# Patient Record
Sex: Female | Born: 1975 | Race: White | Hispanic: No | Marital: Married | Smoking: Former smoker
Health system: Southern US, Community
[De-identification: ages and names within clinical notes are randomized; demographics above are authoritative.]

## PROBLEM LIST (undated history)

## (undated) DIAGNOSIS — C801 Malignant (primary) neoplasm, unspecified: Secondary | ICD-10-CM

## (undated) HISTORY — PX: THYROIDECTOMY: SHX17

## (undated) HISTORY — PX: APPENDECTOMY: SHX54

---

## 2001-04-02 ENCOUNTER — Ambulatory Visit (HOSPITAL_COMMUNITY): Admission: AD | Admit: 2001-04-02 | Discharge: 2001-04-02 | Payer: Self-pay | Admitting: Obstetrics & Gynecology

## 2001-04-02 ENCOUNTER — Encounter (INDEPENDENT_AMBULATORY_CARE_PROVIDER_SITE_OTHER): Payer: Self-pay

## 2002-03-24 ENCOUNTER — Other Ambulatory Visit: Admission: RE | Admit: 2002-03-24 | Discharge: 2002-03-24 | Payer: Self-pay | Admitting: Obstetrics and Gynecology

## 2002-04-25 ENCOUNTER — Encounter: Admission: RE | Admit: 2002-04-25 | Discharge: 2002-04-25 | Payer: Self-pay | Admitting: Thoracic Surgery

## 2002-04-25 ENCOUNTER — Encounter: Payer: Self-pay | Admitting: Thoracic Surgery

## 2007-03-01 ENCOUNTER — Inpatient Hospital Stay (HOSPITAL_COMMUNITY): Admission: AD | Admit: 2007-03-01 | Discharge: 2007-03-05 | Payer: Self-pay | Admitting: Obstetrics & Gynecology

## 2007-03-02 ENCOUNTER — Encounter (INDEPENDENT_AMBULATORY_CARE_PROVIDER_SITE_OTHER): Payer: Self-pay | Admitting: Specialist

## 2008-04-23 ENCOUNTER — Ambulatory Visit (HOSPITAL_COMMUNITY): Admission: RE | Admit: 2008-04-23 | Discharge: 2008-04-23 | Payer: Self-pay | Admitting: Obstetrics and Gynecology

## 2008-11-15 ENCOUNTER — Inpatient Hospital Stay (HOSPITAL_COMMUNITY): Admission: RE | Admit: 2008-11-15 | Discharge: 2008-11-18 | Payer: Self-pay | Admitting: Obstetrics & Gynecology

## 2009-09-20 ENCOUNTER — Inpatient Hospital Stay (HOSPITAL_COMMUNITY): Admission: EM | Admit: 2009-09-20 | Discharge: 2009-09-23 | Payer: Self-pay | Admitting: Emergency Medicine

## 2009-09-20 ENCOUNTER — Encounter (INDEPENDENT_AMBULATORY_CARE_PROVIDER_SITE_OTHER): Payer: Self-pay | Admitting: Surgery

## 2009-09-27 ENCOUNTER — Inpatient Hospital Stay (HOSPITAL_COMMUNITY): Admission: RE | Admit: 2009-09-27 | Discharge: 2009-10-02 | Payer: Self-pay | Admitting: Surgery

## 2010-03-27 ENCOUNTER — Encounter: Admission: RE | Admit: 2010-03-27 | Discharge: 2010-03-27 | Payer: Self-pay | Admitting: Family Medicine

## 2010-06-10 ENCOUNTER — Emergency Department (HOSPITAL_COMMUNITY): Admission: EM | Admit: 2010-06-10 | Discharge: 2010-06-10 | Payer: Self-pay | Admitting: Emergency Medicine

## 2010-10-03 ENCOUNTER — Encounter: Admission: RE | Admit: 2010-10-03 | Discharge: 2010-10-03 | Payer: Self-pay | Admitting: Otolaryngology

## 2010-10-08 ENCOUNTER — Other Ambulatory Visit: Admission: RE | Admit: 2010-10-08 | Discharge: 2010-10-08 | Payer: Self-pay | Admitting: Otolaryngology

## 2010-12-04 ENCOUNTER — Encounter (HOSPITAL_COMMUNITY)
Admission: RE | Admit: 2010-12-04 | Discharge: 2011-01-13 | Payer: Self-pay | Source: Home / Self Care | Attending: Endocrinology | Admitting: Endocrinology

## 2011-03-11 ENCOUNTER — Other Ambulatory Visit: Payer: Self-pay | Admitting: General Surgery

## 2011-03-11 ENCOUNTER — Encounter (HOSPITAL_COMMUNITY): Payer: BC Managed Care – PPO

## 2011-03-11 ENCOUNTER — Other Ambulatory Visit (HOSPITAL_COMMUNITY): Payer: Self-pay | Admitting: General Surgery

## 2011-03-11 ENCOUNTER — Ambulatory Visit (HOSPITAL_COMMUNITY)
Admission: RE | Admit: 2011-03-11 | Discharge: 2011-03-11 | Disposition: A | Discharge status: Home / Self Care | Payer: BC Managed Care – PPO | Source: Ambulatory Visit | Attending: General Surgery | Admitting: General Surgery

## 2011-03-11 DIAGNOSIS — Z01818 Encounter for other preprocedural examination: Secondary | ICD-10-CM | POA: Insufficient documentation

## 2011-03-11 DIAGNOSIS — Z01812 Encounter for preprocedural laboratory examination: Secondary | ICD-10-CM | POA: Insufficient documentation

## 2011-03-11 DIAGNOSIS — E041 Nontoxic single thyroid nodule: Secondary | ICD-10-CM

## 2011-03-11 DIAGNOSIS — Z01810 Encounter for preprocedural cardiovascular examination: Secondary | ICD-10-CM | POA: Insufficient documentation

## 2011-03-11 LAB — CBC
HCT: 43.6 % (ref 36.0–46.0)
Hemoglobin: 14.1 g/dL (ref 12.0–15.0)
MCHC: 32.3 g/dL (ref 30.0–36.0)
MCV: 84.7 fL (ref 78.0–100.0)
Platelets: 139 10*3/uL — ABNORMAL LOW (ref 150–400)
RBC: 5.15 MIL/uL — ABNORMAL HIGH (ref 3.87–5.11)
RDW: 12.8 % (ref 11.5–15.5)
WBC: 5.3 10*3/uL (ref 4.0–10.5)

## 2011-03-11 LAB — HCG, SERUM, QUALITATIVE: Preg, Serum: NEGATIVE

## 2011-03-11 LAB — SURGICAL PCR SCREEN: MRSA, PCR: NEGATIVE

## 2011-03-18 ENCOUNTER — Observation Stay (HOSPITAL_COMMUNITY)
Admission: RE | Admit: 2011-03-18 | Discharge: 2011-03-19 | Disposition: A | Discharge status: Home / Self Care | Payer: BC Managed Care – PPO | Source: Ambulatory Visit | Attending: General Surgery | Admitting: General Surgery

## 2011-03-18 ENCOUNTER — Other Ambulatory Visit: Payer: Self-pay | Admitting: General Surgery

## 2011-03-18 DIAGNOSIS — C73 Malignant neoplasm of thyroid gland: Principal | ICD-10-CM | POA: Insufficient documentation

## 2011-03-18 DIAGNOSIS — Z79899 Other long term (current) drug therapy: Secondary | ICD-10-CM | POA: Insufficient documentation

## 2011-03-19 LAB — COMPREHENSIVE METABOLIC PANEL
ALT: 14 U/L (ref 0–35)
AST: 15 U/L (ref 0–37)
Albumin: 4.1 g/dL (ref 3.5–5.2)
BUN: 2 mg/dL — ABNORMAL LOW (ref 6–23)
BUN: 9 mg/dL (ref 6–23)
CO2: 29 mEq/L (ref 19–32)
Calcium: 8.9 mg/dL (ref 8.4–10.5)
Calcium: 9.4 mg/dL (ref 8.4–10.5)
Chloride: 99 mEq/L (ref 96–112)
Creatinine, Ser: 0.59 mg/dL (ref 0.4–1.2)
GFR calc Af Amer: >60 mL/min (ref >60)
GFR calc non Af Amer: >60 mL/min (ref >60)
Glucose, Bld: 100 mg/dL — ABNORMAL HIGH (ref 70–99)
Sodium: 134 mEq/L — ABNORMAL LOW (ref 135–145)
Total Protein: 6.9 g/dL (ref 6.0–8.3)
Total Protein: 8 g/dL (ref 6.0–8.3)

## 2011-03-19 LAB — CBC
HCT: 31.2 % — ABNORMAL LOW (ref 36.0–46.0)
HCT: 32 % — ABNORMAL LOW (ref 36.0–46.0)
HCT: 35.6 % — ABNORMAL LOW (ref 36.0–46.0)
HCT: 35.7 % — ABNORMAL LOW (ref 36.0–46.0)
Hemoglobin: 11.2 g/dL — ABNORMAL LOW (ref 12.0–15.0)
Hemoglobin: 10.6 g/dL — ABNORMAL LOW (ref 12.0–15.0)
Hemoglobin: 12.1 g/dL (ref 12.0–15.0)
Hemoglobin: 15.1 g/dL — ABNORMAL HIGH (ref 12.0–15.0)
MCHC: 33.8 g/dL (ref 30.0–36.0)
MCV: 83.6 fL (ref 78.0–100.0)
MCV: 83.8 fL (ref 78.0–100.0)
MCV: 84.2 fL (ref 78.0–100.0)
MCV: 85.2 fL (ref 78.0–100.0)
Platelets: 96 10*3/uL — ABNORMAL LOW (ref 150–400)
RBC: 3.91 MIL/uL (ref 3.87–5.11)
RBC: 3.66 MIL/uL — ABNORMAL LOW (ref 3.87–5.11)
RBC: 4.24 MIL/uL (ref 3.87–5.11)
RDW: 13.6 % (ref 11.5–15.5)
RDW: 13.1 % (ref 11.5–15.5)
RDW: 13.8 % (ref 11.5–15.5)
RDW: 14.2 % (ref 11.5–15.5)
WBC: 4.5 10*3/uL (ref 4.0–10.5)
WBC: 4 10*3/uL (ref 4.0–10.5)
WBC: 4.6 10*3/uL (ref 4.0–10.5)
WBC: 4.7 10*3/uL (ref 4.0–10.5)
WBC: 8.3 10*3/uL (ref 4.0–10.5)

## 2011-03-19 LAB — URINALYSIS, ROUTINE W REFLEX MICROSCOPIC
Hgb urine dipstick: NEGATIVE
Ketones, ur: >80 mg/dL — AB

## 2011-03-19 LAB — CLOSTRIDIUM DIFFICILE EIA

## 2011-03-19 LAB — ANAEROBIC CULTURE

## 2011-03-19 LAB — DIFFERENTIAL
Basophils Absolute: 0 10*3/uL (ref 0.0–0.1)
Lymphocytes Relative: 8 % — ABNORMAL LOW (ref 12–46)
Lymphs Abs: 0.6 10*3/uL — ABNORMAL LOW (ref 0.7–4.0)
Neutro Abs: 7.2 10*3/uL (ref 1.7–7.7)

## 2011-03-19 LAB — CULTURE, ROUTINE-ABSCESS

## 2011-03-19 LAB — URINE CULTURE
Colony Count: NO GROWTH
Special Requests: NEGATIVE

## 2011-03-19 LAB — LIPASE, BLOOD: Lipase: 24 U/L (ref 11–59)

## 2011-03-19 LAB — URINE MICROSCOPIC-ADD ON

## 2011-03-19 LAB — POCT PREGNANCY, URINE: Preg Test, Ur: NEGATIVE

## 2011-03-25 NOTE — Discharge Summary (Signed)
  Shari Simmons, Shari Simmons                 ACCOUNT NO.:  [id number]  MEDICAL RECORD NO.:  [id number]           PATIENT TYPE:  O  LOCATION:  1306                         FACILITY:  [facility]  PHYSICIAN:  Lennie Muckle, MD      DATE OF BIRTH:  06/09/1976  DATE OF ADMISSION:  03/18/2011 DATE OF DISCHARGE:  03/19/2011                              DISCHARGE SUMMARY   FINAL DIAGNOSIS:  Right thyroid nodule.  PROCEDURE:  Right thyroidectomy.  HOSPITAL COURSE:  [name] was monitored overnight following a right thyroid lobectomy.  She did well overnight, had some mild swelling on her incision, but no active bleeding was noted.  Her voice is strong. She has some pain which is controlled with oral narcotics.  She will be discharged home with Percocet for pain.  Instructed to take stool softeners as well as to take over-the-counter ibuprofen or Motrin.  She will drive when she can turn her head comfortably.  She will follow up with me in approximately 2 weeks.  I have instructed her to perform no heavy lifting over 10 pounds for 2 weeks.  I will also give her a prescription for a Z-PAK in case she does develop a sinus infection which she does frequently and given her Diflucan in the hospital for yeast infection.  CONDITION UPON DISCHARGE:  Improved.  FINAL DIAGNOSIS:  Right thyroid nodule.     Lennie Muckle, MD     ALA/MEDQ  D:  03/19/2011  T:  03/20/2011  Job:  [id number]  cc:   Dorisann Frames, M.D. Fax: [phone]  Electronically Signed by Bertram Savin MD on 03/25/2011 12:13:54 PM

## 2011-03-25 NOTE — Op Note (Signed)
[name], [name]                 ACCOUNT NO.:  [id number]  MEDICAL RECORD NO.:  [id number]           PATIENT TYPE:  O  LOCATION:  1306                         FACILITY:  [facility]  PHYSICIAN:  Lennie Muckle, MD      DATE OF BIRTH:  1976/12/02  DATE OF PROCEDURE:  03/18/2011 DATE OF DISCHARGE:                              OPERATIVE REPORT   PREOPERATIVE DIAGNOSIS:  Right thyroid nodule.  POSTOPERATIVE DIAGNOSIS:  Right thyroid nodule.  PROCEDURE:  Right thyroidectomy.  SURGEON:  Lennie Muckle, MD  ASSISTANT:  OR staff.  FINDINGS:  Nodule right thyroid.  There was a smaller nodule around the middle of the lobe near the entry of the recurrent laryngeal nerve into the trachea.  There were no enlarged lymph nodes.  SPECIMENS:  Right thyroid.  AMOUNT OF BLOOD LOSS:  Minimal.  COMPLICATIONS:  No immediate.  ANESTHESIA:  General endotracheal anesthesia.  INDICATIONS FOR PROCEDURE:  Shari Simmons is a 35 year old female who was found to have an enlarged thyroid.  She had an ultrasound of the thyroid which revealed a lesion within the right lobe measuring 5 x 3 x 2.6. Imaging showed increased uptake into the thyroid gland, but consistent with a toxic nodule.  She occasionally had palpitations and due to the large size of the lesion, I talked to her about a right thyroidectomy. Her TSH level was 0.06, free T3 and T4 within normal limits.  I discussed surgery with her including the risks of possible recurrent laryngeal nerve injury and possibility of having to do a complete thyroidectomy pending path.  Informed consent was obtained.  DETAILS OF PROCEDURE:  Shari Simmons was identified in the preoperative holding area.  I marked the right side of her neck.  She was seen by Anesthesia and received IV antibiotics.  She was then taken to the operating suite, placed in supine position.  After administration of general endotracheal anesthesia, a towel roll was placed behind her neck.  The  anterior neck was prepped and draped in the usual sterile fashion.  Surgical time-out performed.  I began by measuring 2 fingerbreadths above the sternal notch.  Measured an incision approximately 4 cm in size.  Divided the skin with a #15 blade.  Divided subcutaneous tissues and platysma muscle with the electrocautery. Created flaps superiorly and inferiorly. I divided the strap muscles in the midline.  Once I divided the strap muscles, I was able to identify the right lobe of the thyroid gland.  It was soft inside.  I lifted the strap muscles off the right, began dissecting near the superior pole. Carefully dissected around the superior pole, clipped and divided the superior pole artery.  I then continued dissecting down towards the lower portion of the gland.  The strap muscles swept easily off the lobe and I was able to sweep this up into the operative field.  I clipped and divided the inferior thyroid artery.  I dissected towards the middle pole vessels.  I stayed high on the thyroid gland.  There was a small nodule near entry of the middle thyroid artery.  I clipped  and divided the smaller feeding branches of the mid thyroid vessels.  Using a #15 blade, I excised the thyroid off of the trachea around the vicinity of recurrent laryngeal nerve.  I then continued to dissect it with electrocautery.  This came easily off the trachea.  I then visualized the recurrent laryngeal nerve.  I marked the specimen with a short stitch on the superior pole and a longer stitch around the hard nodule. This was passed off the operative field.  Once I divided the isthmus with the Harmonic scalpel, I passed it off the operative field.  I irrigated the wound bed.  There was a small vessel which I clipped to control oozing.  I then placed Surgicel within the wound bed.  The patient was given large Valsalva, there was no bleeding noted.  I then reapproximated the strap muscles using running 3-0 Vicryl,  platysma was closed with 3-0 Vicryl interrupted sutures.  Dermis was closed with 3-0 Vicryl and skin was closed with 4-0 Monocryl.  15 mL of 0.25% Marcaine with epinephrine were used for local anesthesia.  The patient was awoken and transferred to postanesthesia care unit in stable condition.  Her voice is strong and there is a minimal amount of swelling at the incision site.     Lennie Muckle, MD     ALA/MEDQ  D:  03/18/2011  T:  03/19/2011  Job:  [id number]  cc:   Dorisann Frames, M.D. Fax: [phone]  Electronically Signed by Bertram Savin MD on 03/25/2011 12:13:48 PM

## 2011-03-26 ENCOUNTER — Other Ambulatory Visit: Payer: Self-pay | Admitting: General Surgery

## 2011-03-26 DIAGNOSIS — C482 Malignant neoplasm of peritoneum, unspecified: Secondary | ICD-10-CM

## 2011-03-26 DIAGNOSIS — C73 Malignant neoplasm of thyroid gland: Secondary | ICD-10-CM

## 2011-03-26 DIAGNOSIS — D630 Anemia in neoplastic disease: Secondary | ICD-10-CM

## 2011-03-26 DIAGNOSIS — R599 Enlarged lymph nodes, unspecified: Secondary | ICD-10-CM

## 2011-03-30 ENCOUNTER — Encounter (HOSPITAL_COMMUNITY)
Admission: RE | Admit: 2011-03-30 | Discharge: 2011-03-30 | Disposition: A | Discharge status: Home / Self Care | Payer: BC Managed Care – PPO | Source: Ambulatory Visit | Attending: General Surgery | Admitting: General Surgery

## 2011-03-30 ENCOUNTER — Ambulatory Visit
Admission: RE | Admit: 2011-03-30 | Discharge: 2011-03-30 | Disposition: A | Discharge status: Home / Self Care | Payer: BC Managed Care – PPO | Source: Ambulatory Visit | Attending: General Surgery | Admitting: General Surgery

## 2011-03-30 DIAGNOSIS — R599 Enlarged lymph nodes, unspecified: Secondary | ICD-10-CM

## 2011-03-30 DIAGNOSIS — C73 Malignant neoplasm of thyroid gland: Secondary | ICD-10-CM

## 2011-03-30 DIAGNOSIS — Z01818 Encounter for other preprocedural examination: Secondary | ICD-10-CM | POA: Insufficient documentation

## 2011-03-30 DIAGNOSIS — Z01812 Encounter for preprocedural laboratory examination: Secondary | ICD-10-CM | POA: Insufficient documentation

## 2011-03-30 LAB — BASIC METABOLIC PANEL
BUN: 9 mg/dL (ref 6–23)
CO2: 30 mEq/L (ref 19–32)
Calcium: 9 mg/dL (ref 8.4–10.5)
Chloride: 106 mEq/L (ref 96–112)
Creatinine, Ser: 0.81 mg/dL (ref 0.4–1.2)
GFR calc Af Amer: >60 mL/min (ref >60)

## 2011-03-30 LAB — SURGICAL PCR SCREEN: MRSA, PCR: NEGATIVE

## 2011-03-30 MED ORDER — IOHEXOL 300 MG/ML  SOLN
75.0000 mL | Freq: Once | INTRAMUSCULAR | Status: AC | PRN
  Administered 2011-03-30: 75 mL via INTRAVENOUS

## 2011-03-31 ENCOUNTER — Other Ambulatory Visit: Payer: Self-pay | Admitting: General Surgery

## 2011-03-31 ENCOUNTER — Observation Stay (HOSPITAL_COMMUNITY)
Admission: RE | Admit: 2011-03-31 | Discharge: 2011-04-02 | Disposition: A | Discharge status: Home / Self Care | Payer: BC Managed Care – PPO | Source: Ambulatory Visit | Attending: General Surgery | Admitting: General Surgery

## 2011-03-31 DIAGNOSIS — C73 Malignant neoplasm of thyroid gland: Principal | ICD-10-CM | POA: Insufficient documentation

## 2011-04-02 LAB — CALCIUM: Calcium: 8.5 mg/dL (ref 8.4–10.5)

## 2011-04-20 NOTE — Op Note (Signed)
Shari Simmons, Shari Simmons                 ACCOUNT NO.:  [id number]  MEDICAL RECORD NO.:  [id number]           PATIENT TYPE:  O  LOCATION:  5114                         FACILITY:  [facility]  PHYSICIAN:  Lennie Muckle, MD      DATE OF BIRTH:  Dec 30, 1975  DATE OF PROCEDURE:  03/31/2011 DATE OF DISCHARGE:                              OPERATIVE REPORT   PREOPERATIVE DIAGNOSIS:  Papillary cancer, right thyroid.  POSTOPERATIVE DIAGNOSIS:  Papillary cancer, right thyroid.  PROCEDURE:  Completion thyroidectomy.  SURGEON:  Lennie Muckle, MD  ASSISTANT:  Anselm Pancoast. Weatherly, MD  FINDINGS:  Normal size left thyroid, two normal sized parathyroids on the left.  SPECIMENS:  Left thyroid gland.  ESTIMATED BLOOD LOSS:  Minimal amount of blood loss.  No immediate complications.  INDICATION FOR PROCEDURE:  Shari Simmons is a 35 year old female who underwent a right thyroidectomy by me approximately 10 days ago.  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  There was also an evidence of hyperplastic nodule.  There was a question of whether or not the margin was involved at the surgery date.  Due to her young age and the possibility of a positive margin, I discussed the case with Dr. Dorisann Frames.  Due to her age, we felt it was probably in her best interest to do a completion thyroidectomy.  Then, it would be easier to fire her thyroglobulin levels.  I called the patient and discussed this over the phone.  She agreed to the completion thyroidectomy.  DETAILS OF THE PROCEDURE:  Shari Simmons was identified in the preoperative holding area.  She was seen by Anesthesia, given preop antibiotics.  She was then taken to the operating room, placed in supine position.  After administration of general endotracheal anesthesia, sequential compression devices were applied to her lower extremity.  Her anterior neck was prepped and draped in the usual sterile fashion.  Surgical time- out performed.  I  placed an incision through the old site with a #15 blade, encountered the 3-0 Vicryl suture, opened this with electrocautery.  Removed the sutures, I then found the midline of the strap muscle.  There was some perioperative edema and swelling; however, this made the planes easy.  I was able to pull off the strap muscle on the left from the thyroid gland on the left.  I identified the inferior pole vessels, clipped and divided these without difficulty.  I began dissecting on the superior pole, placed 2-0 silk sutures on the superior pole vessels, also clipped.  I then transected this with a Harmonic scalpel, continued dissecting down towards the middle pole.  I was able to identify the superior parathyroid gland, swept this down to stay into the patient's neck.  We identified the recurrent laryngeal nerve, continued dissecting, clipping the small feeding middle thyroidal vessels.  The specimen was then removed from the trachea using the electrocautery and a stitch on the superior pole of the left thyroid gland.  This specimen was passed off from the operative field.  We finger palpated in the left side of the neck, found  no significant abnormalities, visualized the nerve.  I then palpated on the right side of the neck, found no significant swelling or concerning lymph nodes. The wound bed was irrigated, placed Surgicel within the wound bed, and then reapproximated the strap muscles at the midline using a running 3-0 Vicryl suture.  I reapproximated the platysma using an interrupted 3-0 Vicryl.  Epidermis was closed with 4-0 Monocryl.  I injected approximately 15 mL of 0.25% Marcaine for local anesthetic.  Steri- Strips were placed for final dressing.  The patient was extubated and transferred to postanesthesia care unit in stable condition.  She will be monitored today.  I will check her calcium and likely discharge her home tomorrow.     Lennie Muckle, MD     ALA/MEDQ  D:   03/31/2011  T:  04/01/2011  Job:  [id number]  cc:   Dorisann Frames, M.D.  Electronically Signed by Bertram Savin MD on 04/20/2011 10:39:48 AM

## 2011-04-20 NOTE — Discharge Summary (Signed)
  [name], Shari Simmons                 ACCOUNT NO.:  [id number]  MEDICAL RECORD NO.:  [id number]           PATIENT TYPE:  O  LOCATION:  5114                         FACILITY:  [facility]  PHYSICIAN:  Lennie Muckle, MD      DATE OF BIRTH:  [birth year]  DATE OF ADMISSION:  03/31/2011 DATE OF DISCHARGE:  04/02/2011                              DISCHARGE SUMMARY   FINAL DIAGNOSIS:  Papillary right thyroid cancer.  PROCEDURE:  March 31, 2011, completion thyroidectomy.  HOSPITAL COURSE:  [name] was admitted following her completion thyroidectomy.  The surgery itself was uneventful.  She had issues with postoperative pain.  She had to be changed from Vicodin to Percocet 7.5 in order to fully control her pain.  She had intermittent morphine but now feels much better today.  She is still having some pain in the anterior portion of her neck and some stiffness in the posterior region. Her incision is without any signs of infection.  No drainage.  Her calcium only dropped to 7.7, today is 8.5.  I had placed her on Tums 3-4 times a day, I have told her she could take three 3 times a day and I think that probably will be fine.  She is going to follow up with me in approximately 2-3  weeks' time.  FINAL DIAGNOSIS:  Papillary cancer.  CONDITION UPON DISCHARGE:  Good.     Lennie Muckle, MD     ALA/MEDQ  D:  04/02/2011  T:  04/02/2011  Job:  [id number]  cc:   Dorisann Frames, M.D.  Electronically Signed by Bertram Savin MD on 04/20/2011 10:39:46 AM

## 2011-04-28 NOTE — Op Note (Signed)
Shari Simmons, Shari Simmons                 ACCOUNT NO.:  [id number]   MEDICAL RECORD NO.:  [id number]          PATIENT TYPE:  INP   LOCATION:  9101                          FACILITY:  [facility]   PHYSICIAN:  Gerrit Friends. Aldona Bar, M.D.   DATE OF BIRTH:  01/01/1976   DATE OF PROCEDURE:  DATE OF DISCHARGE:                               OPERATIVE REPORT   PREOPERATIVE DIAGNOSES:  Term pregnancy, previous cesarean section,  desire for repeat cesarean section.   POSTOPERATIVE DIAGNOSES:  Term pregnancy, previous cesarean section,  desire for repeat cesarean section plus delivery of 9-pound 3-ounce female  infant, Apgars 9 and 9.   PROCEDURE:  Repeat low-transverse cesarean section.   SURGEON:  Gerrit Friends. Aldona Bar, MD   ASSISTANT:  Freddrick March. Tenny Craw, MD   ANESTHESIA:  Spinal, Raul Del, MD   PROCEDURE:  The patient was taken to the operating room after  satisfactory induction of the spinal anesthetic by Dr. Tacy Dura.  The  patient was prepped and draped in the usual fashion having placed in a  supine position, slightly tilted left, and the Foley catheter was  inserted sterile prepped.   After the patient was adequately draped and good anesthetic levels were  documented, procedure was begun.   A Pfannenstiel incision was made with minimal difficulty and dissected  down to and through the fascia in low-transverse fashion with hemostasis  created at each layer.  Subfascial space was created inferiorly and  superiorly.  Muscles were separated in the midline.  Peritoneum  identified and entered appropriately with care taken to avoid the bowel  superiorly and bladder inferiorly.  At this time, the vesicouterine  peritoneum was identified, incised in a low-transverse fashion, pushed  off the lower uterine segment with ease, and then sharp incision to the  lower uterine segment was made in the transverse position with  Metzenbaum scissors.  Amniotomy was created with production of clear  fluid, and incision was  extended laterally with fingers.  Thereafter,  with minimal difficulty delivery of a 9-pound 3-ounce female infant with  Apgars of 9 and 9 was carried out from the vertex position without  difficulty.  After the cord was clamped and cut, the infant was passed  off to the awaiting team and was subsequently taken to the nursery in  good condition.   Placenta was delivered intact and passed off - the patient was a cord  blood donor and cord bloods will be obtained accordingly.   At this time, the uterus was exteriorized and good uterine contractility  was noted.  We slowly gave intravenous Pitocin and manual stimulation.  The uterus was explored and rendered free of any remaining products of  conception.  At this time, uterine incision was closed using a single  layer of #1 Vicryl in a running-locking fashion with excellent results.   Tubes and ovaries appeared normal.  Uterus was well contracted.  Uterine  incision was dry.  At this time, the uterus was replaced into the  abdominal cavity, and after all counts were noted to be correct and no  foreign bodies  were noted to be remaining in the abdominal cavity,  closure of the abdomen was carried out in layers.   The abdominal peritoneum was closed with 0-Vicryl in a running fashion  and muscle secured with same.  Assured good fascial hemostasis, and the  fascia was then reapproximated using 0-Vicryl in running fashion from  angle to midline bilaterally.  Subcutaneous tissue was then rendered  hemostatic and staples were then used to close the skin.  A sterile  pressure dressing was applied.  At this time, the patient was  transported to recovery in satisfactory condition having tolerated the  procedure well.  Estimated blood loss 500 mL.  All counts correct x2.  At the conclusion of the procedure, both mother and baby were doing well  in their respective recovery areas.      Gerrit Friends. Aldona Bar, M.D.  Electronically Signed     RMW/MEDQ   D:  11/15/2008  T:  11/16/2008  Job:  [id number]

## 2011-04-28 NOTE — Discharge Summary (Signed)
Shari Simmons, Shari Simmons                 ACCOUNT NO.:  [id number]   MEDICAL RECORD NO.:  [id number]          PATIENT TYPE:  INP   LOCATION:  9101                          FACILITY:  [facility]   PHYSICIAN:  Gerrit Friends. Aldona Bar, M.D.   DATE OF BIRTH:  1976/03/13   DATE OF ADMISSION:  11/15/2008  DATE OF DISCHARGE:  11/18/2008                               DISCHARGE SUMMARY   DISCHARGE DIAGNOSES:  1. Term pregnancy, delivered 9 pounds 3 ounces female infant with Apgars      of 9 and 9.  2. Blood type O positive.  3. Previous cesarean section.   PROCEDURE:  A repeat low transverse cesarean section.   SUMMARY:  This 35 year old gravida 6, para 1, was admitted at term for  repeat cesarean section, having had a previous cesarean section and a  suspected large baby with this pregnancy.  She was taken to the  operating room on November 15, 2008, at which time she underwent a repeat  low transverse cesarean section with delivery of a 9 pounds 3 ounces  female infant with Apgars of 9 and 9.  Her postoperative course was  totally benign.  Her discharge hemoglobin was 9.9 with a white count of  8000, and platelet count of 99,000.  On the morning of November 18, 2008,  she was ambulating well, tolerating a regular diet well, having normal  bowel and bladder function.  Her wound was dry and her breast-feeding  was going well.  She was desirous of discharge.  Accordingly, her  staples were removed and wound was Steri-Strip with Benzoin.  She was  given all appropriate instructions per discharge brochure and understood  all instructions well.   DISCHARGE MEDICATIONS:  1. Vitamins 1 a day as long as she is breast feeding.  2. Feosol capsules 1 daily.  3. Motrin 600 mg every 6 hours as needed for cramping or pain.  4. Tylox 1-2 every 4-6 hours as needed for severe pain.   She will return to the office and follow up in approximately four weeks'  time or as needed.   CONDITION ON DISCHARGE:  Improved.      Gerrit Friends. Aldona Bar, M.D.  Electronically Signed     RMW/MEDQ  D:  11/18/2008  T:  11/18/2008  Job:  [id number]

## 2011-05-01 NOTE — Op Note (Signed)
NAMEJUDEE, Shari Simmons              ACCOUNT NO.:  [id number]   MEDICAL RECORD NO.:  [id number]          PATIENT TYPE:  INP   LOCATION:  9170                          FACILITY:  [facility]   PHYSICIAN:  Gerrit Friends. Aldona Bar, M.D.   DATE OF BIRTH:  08/22/1976   DATE OF PROCEDURE:  03/02/2007  DATE OF DISCHARGE:                               OPERATIVE REPORT   PREOPERATIVE DIAGNOSES:  1. Term pregnancy.  2. Active labor.  3. Failure to progress.  4. Maternal fever.  5. Fetal tachycardia.   POSTOPERATIVE DIAGNOSES:  1. Term pregnancy.  2. Active labor.  3. Failure to progress.  4. Maternal fever.  5. Fetal tachycardia.  6. Delivery of 8-pound 8-ounce female infant, Apgars 7 and 8.   PROCEDURE:  Primary low transverse cesarean section.   SURGEON:  Dr. Aldona Bar.   ANESTHESIA:  Epidural.   HISTORY:  This 35 year old gravida 5, para 0 who presented at term on  the afternoon of March 18 in early labor.  When I first checked her at  7:30 on the morning of March 19, she was 4-cm dilated, vertex at -1  station, but the anterior cervix was very swollen and edematous.  The  patient had previously ruptured her membranes at 1:00 a.m., and an  epidural was placed at about 6:00 a.m.  Her cervix was recorded as being  about 4 cm dilated at 5:30 a.m.   I placed an IUPC and continued the Pitocin augmentation.  On evaluation,  the patient measured about 41-42 cm, and fetal heart was reactive.   By 10:15 a.m., there had been no real change to her cervix after good  documented contractions, and indeed, by this time, the patient had  developed a fever of 101 which was treated about 1 hour earlier with 2 g  of ampicillin IV.  There was now an associated fetal tachycardia as  well.  Because of failure to progress, decision was made to proceed with  primary low transverse cesarean section for delivery.   The patient was taken to the operating room where the epidural was  augmented.  A Foley catheter continued to  be in place.  After the  epidural was fully adequate and the patient had prepped and draped in  usual fashion for cesarean section, having placed in supine position  slightly tilted to the left, procedure was begun.   Pfannenstiel incision was made with minimal difficulty, dissected down  sharply to and through the fascia in a low transverse fashion with  hemostasis created at each layer.  Subfascial space was created  inferiorly and superiorly, and muscles separated in the midline.  Peritoneum was identified and entered appropriately with care taken to  avoid the bowel superiorly and bladder inferiorly.  At this time, the  vesicouterine peritoneum was incised in a low transverse fashion and  pushed off the lower uterine segment with ease.  Sharp incision into the  lower segment transverse fashion was then made with Metzenbaum scissors  and extended laterally.  The vertex was elevated from the pelvis and  thereafter, an 8-pound 8-ounce female infant with  Apgars of 7 and 8 was  delivered.  Baby cried spontaneously at once, and after the cord was  clamped and cut, the infant was passed off to the awaiting team and  ultimately taken to the nursery in good condition.   After the placenta was delivered intact, the uterus was exteriorized.  Placenta was sent to pathology, labeled appropriately.  The uterus was  rendered free of any remaining products of conception, and good uterine  contractility was afforded with slowly given intravenous Pitocin and  stimulation.  At this time, the uterine incision was closed using a  double layer of #1 Vicryl, first layer being a running locking and the  second layer being a running imbricating.  Hemostasis was excellent, and  one additional figure-of-eight 0 Vicryl suture was placed in the right  angle, and at this time, the incision appeared to be hemostatic.  Tubes  and ovaries were inspected and noted to be normal, and after the abdomen  was lavaged of  all free blood and clot, the uterus was replaced into the  abdominal cavity, and after all counts were correct and no foreign  bodies remained in the abdominal cavity, closure of the abdomen was  carried out in layers.  The abdominal peritoneum was closed with 0  Vicryl in a running fashion and muscle secured with same.  Assured of  good fascial hemostasis, fascia was then reapproximated using 0 Vicryl  from angle to midline bilaterally.  Subcutaneous tissues were  hemostatic, and staples were used to close skin.  Sterile pressure was  applied, and the patient thereafter was transported to the recovery in  satisfactory condition, having tolerated the procedure well.  Estimated  blood loss 500 mL.  All counts correct x2.   At the conclusion of procedure, both mother and baby were doing well in  their respective recovery areas.   In summary, this patient presented in early labor and essentially failed  to progress in spite of good documented labor by IUPC and was taken to  the operating room for primary low transverse cesarean section.  By this  time, she had also developed a maternal fever, and there was associated  fetal tachycardia.      Gerrit Friends. Aldona Bar, M.D.  Electronically Signed     RMW/MEDQ  D:  03/02/2007  T:  03/02/2007  Job:  [id number]

## 2011-05-01 NOTE — Op Note (Signed)
[facility]  Patient:    Shari Simmons, Shari Simmons                     MRN: [id number] Proc. Date: 04/02/01 Adm. Date:  [id number] Disc. Date: [id number] Attending:  Mickle Mallory                           Operative Report  PATIENT AGE:                  35.  PREOPERATIVE DIAGNOSIS:       Incomplete miscarriage, blood type O positive.  POSTOPERATIVE DIAGNOSIS:      Incomplete miscarriage, blood type O positive, pathology pending.  OPERATION:                    Suction, dilatation and curettage.  SURGEON:                      Gerrit Friends. Aldona Bar, M.D.  ANESTHESIA:                   Intravenous conscious sedation and paracervical block with 1% Xylocaine with epinephrine.  HISTORY:                      This gravida 2, para 0 was seen in the office two days ago.  Although she was supposed to be [redacted] weeks pregnant, was told that she probably was only about [redacted] weeks pregnant.  An ultrasound was not done.  She began having spotting on Friday which progressed to heavier bleeding today, April 20, and she presented to triage for evaluation after calling me.  By her menses, she would be 11 weeks by dates.  The cervix was closed, but there was a moderate amount of bleeding noted coming from the cervix.  The uterus was 6 to 7 weeks size at best.  An ultrasound revealed a 6-week to 7-week sac, no fetal pole, no fetal heart, with a lot of subchorionic hemorrhage.  Diagnosis of incomplete miscarriage was made, and a D&C was offered to the patient.  She is taken to the operating room at this point for [facility].  DESCRIPTION OF PROCEDURE:     The patient was taken to the operating room where, after satisfactory induction of intravenous conscious sedation, she was prepped and draped, having been placed in the short Allen stirrups in the modified lithotomy position.  The bladder was drained of clear yellow urine with the red rubber catheter in in and out fashion.  At this  time, examination under anesthesia was carried out with findings consistent with approximately 6-week size uterus.  A speculum was placed, and a paracervical block was carried out with 20 cc of 1% Xylocaine without epinephrine after the cervix was grasped on the anterior lip with a tenaculum.  Thereafter, the internal os was dilated to a #25 Pratt dilator.  (It was already dilated.)  Thereafter, using a #8 suction curet which had been appropriately testing, the intrauterine cavity was thoroughly and systematically and gently evacuated of all products of conception which were sent to patient.  Curettage with a small standard curet was then carried out with no further production of tissue as was resuctioning.  The procedure at this time was felt to be complete and was taken to the recovery room after all instruments had been removed.  Blood type, as mentioned, is  0 positive.  The patient will be discharged to home with a prescription for doxycycline 100 mg to use twice daily for a total of 4 days and Anaprox DS to use 1 every 8 hours as needed for cramping.  She will return to the office in approximately one weeks time and will be given a detailed instruction sheet at the time of discharge from recovery. DD:  04/02/01 TD:  04/04/01 Job: [id number]

## 2011-05-01 NOTE — Discharge Summary (Signed)
Shari Simmons, Shari Simmons              ACCOUNT NO.:  [id number]   MEDICAL RECORD NO.:  [id number]          PATIENT TYPE:  INP   LOCATION:  9128                          FACILITY:  [facility]   PHYSICIAN:  Randye Lobo, M.D.   DATE OF BIRTH:  1976/10/27   DATE OF ADMISSION:  03/01/2007  DATE OF DISCHARGE:  03/05/2007                               DISCHARGE SUMMARY   FINAL DIAGNOSES:  1. Intrauterine pregnancy at term.  2. Active labor.  3. Failure to progress.  4. Maternal fever.  5. Fetal tachycardia.  6. Chorioamnionitis.   PROCEDURE:  Primary low transverse cesarean section.  Surgeon:  Dr.  Annamaria Helling.  Complications:  None.   This 35 year old G5, P0-0-4-0 presents at term in early labor.  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  I do not see where she  was on any prophylactic antivirals at this point.  She was admitted at  this time in early labor.  She did have a negative group B strep culture  obtained in our office at 36 weeks.  When she was checked on the morning  of March 19 by Dr. Annamaria Helling, the patient was about 4 cm dilated and -  1 station and the anterior cervix was very swollen and edematous.  The  patient had previously ruptured her membranes at 1 a.m. previously, and  the epidural was placed around 6:00.  IUPCs were placed and Pitocin  augmentation was continued.  By 10:15 there was no real change in her  cervix.  At this point the patient had developed a fever of 101 and was  treated an hour earlier with 2 g of ampicillin IV.  There was now an  associated fetal tachycardia.  Because of this failure to progress and  the maternal fever, probable chorioamnionitis, a decision was made to  proceed with a cesarean section.  The patient was taken to the operating  room on March 02, 2007, by Dr. Annamaria Helling where a primary low  transverse cesarean section was performed with the delivery of an  8-  pound 8-ounce female infant with Apgars of 7 and 8.  Delivery went without  complications.  The patient's postoperative course was benign.  Her  temperatures did resolve.  She had been on Unasyn.  By postoperative day  #3 the patient was ambulating well.  She had been afebrile and she was  felt ready for discharge.  She was sent home on a regular diet, told to  decrease activities, told to continue her prenatal vitamins and an iron  supplement three times daily, was given Percocet one to two every 4-6  hours as needed for pain, told she could use over-the-counter ibuprofen  up to 600 mg every 6 hours as needed for pain, was to follow up in our  office in 4 weeks, of course to call with any increased bleeding, fever,  pain or problems.   LABORATORIES ON DISCHARGE:  The patient had a hemoglobin of 7.7 which  was down from  a preoperative level of 11.8.  She had a white blood cell  count of 10.4 and platelets of 101,000.      Leilani Able, P.A.-C.      Randye Lobo, M.D.  Electronically Signed    MB/MEDQ  D:  04/05/2007  T:  04/05/2007  Job:  [id number]

## 2011-05-20 ENCOUNTER — Other Ambulatory Visit (HOSPITAL_COMMUNITY): Payer: Self-pay | Admitting: Endocrinology

## 2011-05-20 DIAGNOSIS — C73 Malignant neoplasm of thyroid gland: Secondary | ICD-10-CM

## 2011-06-01 ENCOUNTER — Encounter (HOSPITAL_COMMUNITY)
Admission: RE | Admit: 2011-06-01 | Discharge: 2011-06-01 | Disposition: A | Discharge status: Home / Self Care | Payer: BC Managed Care – PPO | Source: Ambulatory Visit | Attending: Endocrinology | Admitting: Endocrinology

## 2011-06-01 DIAGNOSIS — C73 Malignant neoplasm of thyroid gland: Secondary | ICD-10-CM

## 2011-06-01 HISTORY — DX: Malignant (primary) neoplasm, unspecified: C80.1

## 2011-06-02 ENCOUNTER — Encounter (HOSPITAL_COMMUNITY)
Admission: RE | Admit: 2011-06-02 | Discharge: 2011-06-02 | Disposition: A | Discharge status: Home / Self Care | Payer: BC Managed Care – PPO | Source: Ambulatory Visit | Attending: Endocrinology | Admitting: Endocrinology

## 2011-06-03 ENCOUNTER — Encounter (HOSPITAL_COMMUNITY)
Admission: RE | Admit: 2011-06-03 | Discharge: 2011-06-03 | Disposition: A | Discharge status: Home / Self Care | Payer: BC Managed Care – PPO | Source: Ambulatory Visit | Attending: Endocrinology | Admitting: Endocrinology

## 2011-06-03 ENCOUNTER — Encounter (HOSPITAL_COMMUNITY): Payer: Self-pay

## 2011-06-03 LAB — HCG, SERUM, QUALITATIVE: Preg, Serum: NEGATIVE

## 2011-06-03 MED ORDER — SODIUM IODIDE I 131 CAPSULE
159.0000 | Freq: Once | INTRAVENOUS | Status: AC | PRN
  Administered 2011-06-03: 159 via ORAL

## 2011-06-10 ENCOUNTER — Encounter (HOSPITAL_COMMUNITY)
Admission: RE | Admit: 2011-06-10 | Discharge: 2011-06-10 | Disposition: A | Discharge status: Home / Self Care | Payer: BC Managed Care – PPO | Source: Ambulatory Visit | Attending: Endocrinology | Admitting: Endocrinology

## 2011-06-10 DIAGNOSIS — C73 Malignant neoplasm of thyroid gland: Secondary | ICD-10-CM | POA: Insufficient documentation

## 2011-06-10 DIAGNOSIS — E0789 Other specified disorders of thyroid: Secondary | ICD-10-CM | POA: Insufficient documentation

## 2011-09-16 ENCOUNTER — Other Ambulatory Visit: Payer: Self-pay | Admitting: Obstetrics & Gynecology

## 2011-09-17 LAB — CBC
HCT: 29 % — ABNORMAL LOW (ref 36.0–46.0)
Hemoglobin: 12.9 g/dL (ref 12.0–15.0)
MCV: 87.6 fL (ref 78.0–100.0)
RBC: 3.31 MIL/uL — ABNORMAL LOW (ref 3.87–5.11)
RBC: 4.4 MIL/uL (ref 3.87–5.11)
WBC: 8 10*3/uL (ref 4.0–10.5)

## 2011-10-19 IMAGING — CT CT NECK W/ CM
4 of 5 series · 16 of 33 positions shown, 19 images · IV contrast (75CC OMNI 300)
Comparison: 10/03/2010

CLINICAL DATA: Thyroid cancer.  Right thyroidectomy.  Rapidly
enlarging nodule on the left.  Assess for lymphadenopathy.

CT NECK WITH CONTRAST
TECHNIQUE: Multidetector CT imaging of the neck was performed with
intravenous contrast.
Contrast: 75 ml Imnipaque-FNN

[Series 3: axial neck · axial · 0.39mm/px · z∈[-199,-132]mm · 2 of 107 slices shown]
[im 27/107  bone]
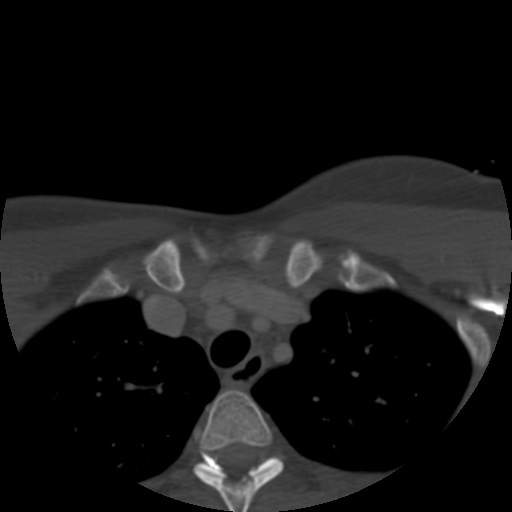
[im 54/107  bone]
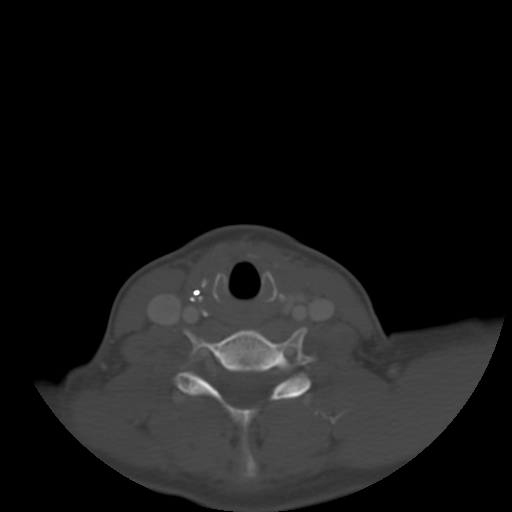

[Series 200: cor · coronal · 0.54mm/px · 3 of 88 slices shown]
[im 26/88  bone]
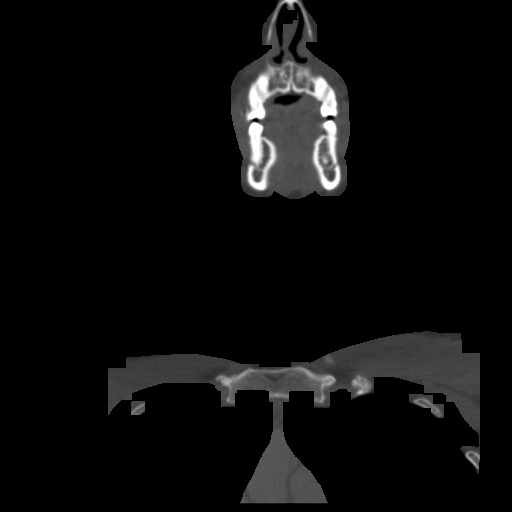
[im 38/88  bone]
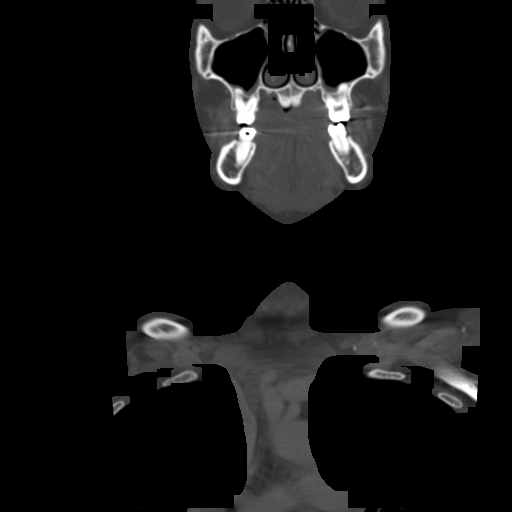
[im 50/88  bone]
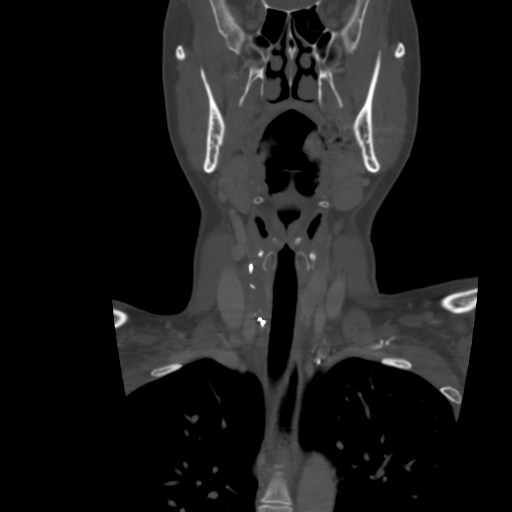

[Series 201: sag · sagittal · 0.54mm/px · 5 of 88 slices shown, 6 images]
[im 30/88  bone]
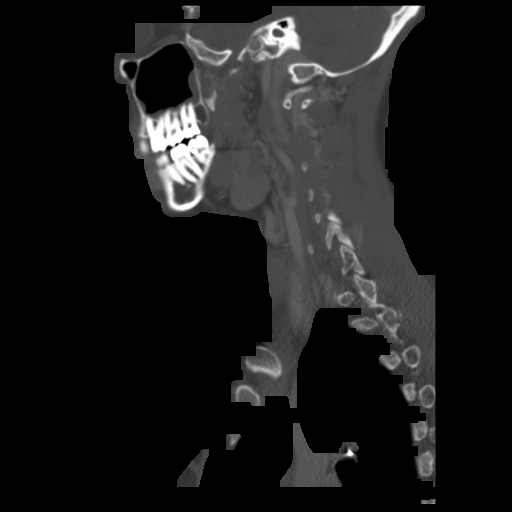
[im 37/88  bone]
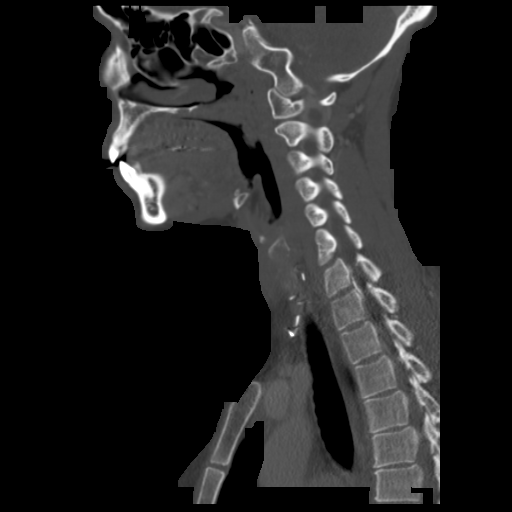
[im 44/88  soft-tissue]
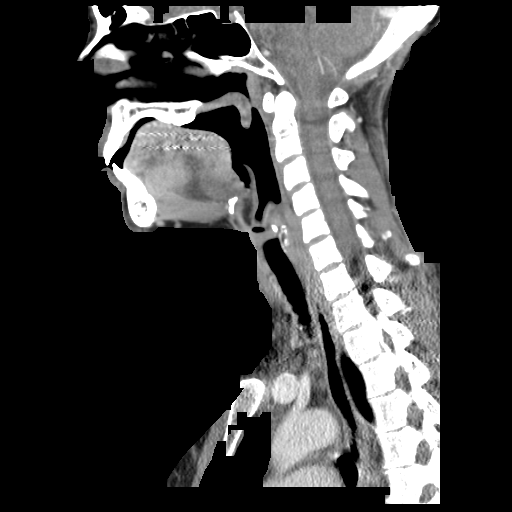
[im 44/88  bone]
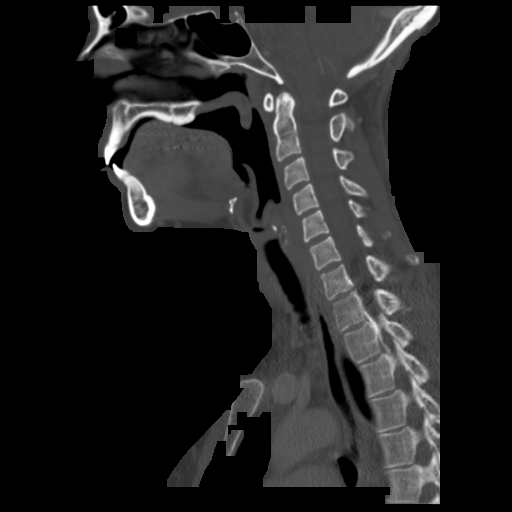
[im 51/88  bone]
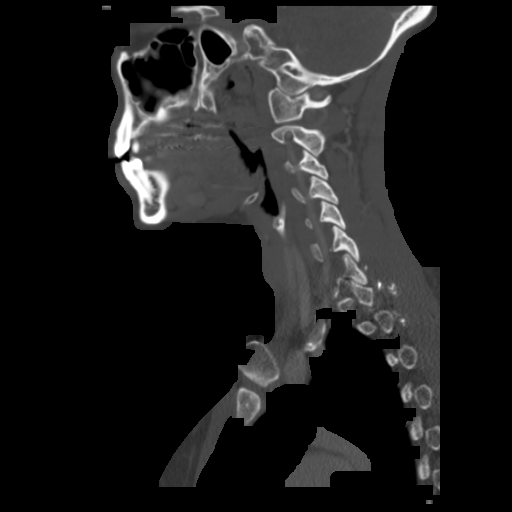
[im 59/88  bone]
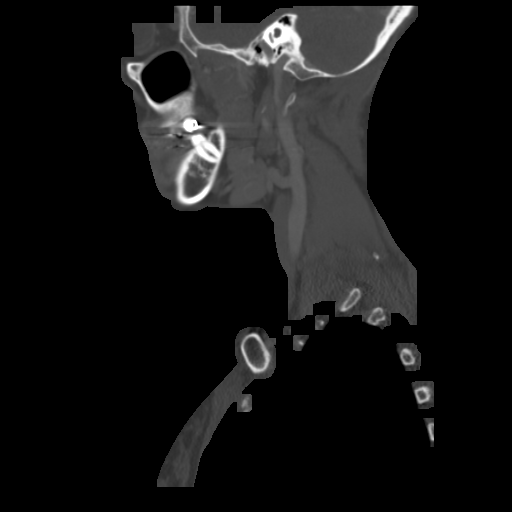

[Series 202: axial · axial · 0.35mm/px · z∈[-269,-60]mm · 6 of 163 slices shown, 8 images]
[im 24/163  soft-tissue]
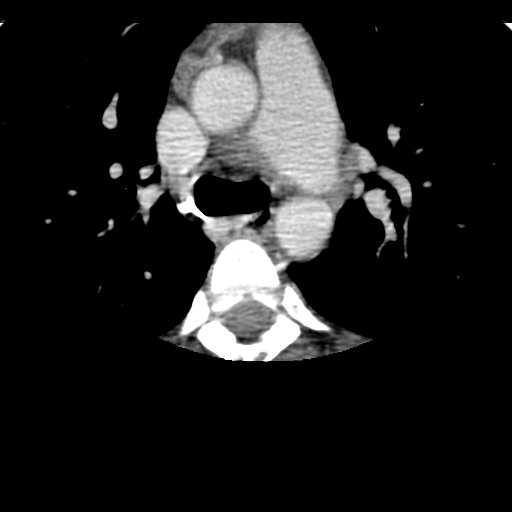
[im 24/163  bone]
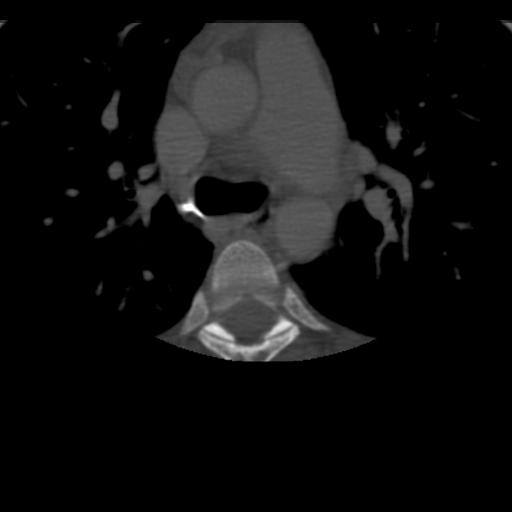
[im 47/163  bone]
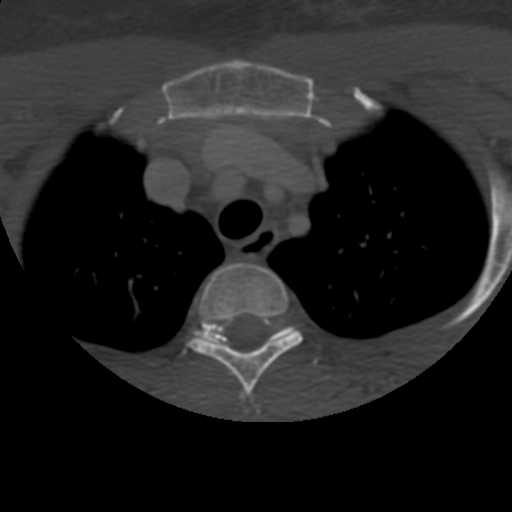
[im 70/163  bone]
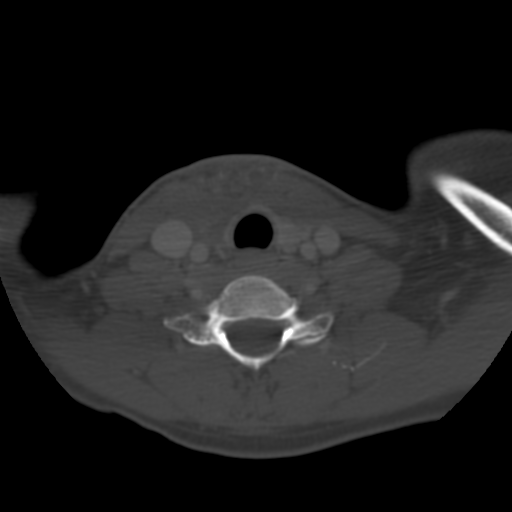
[im 93/163  bone]
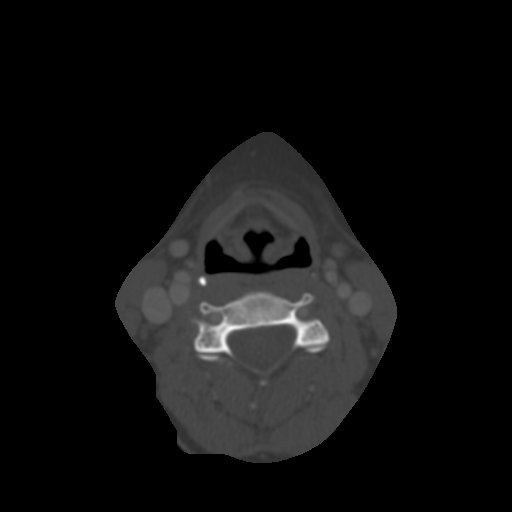
[im 116/163  soft-tissue]
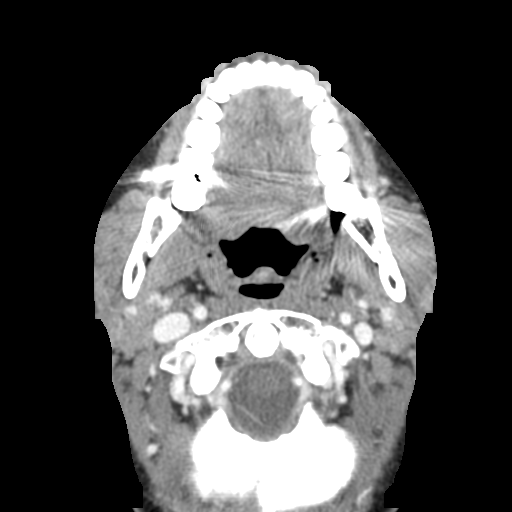
[im 116/163  bone]
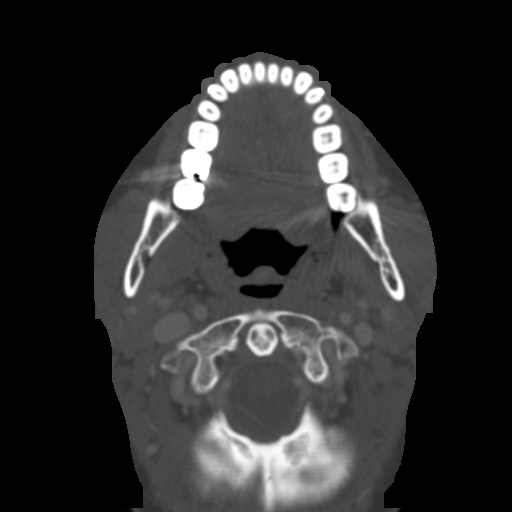
[im 139/163  bone]
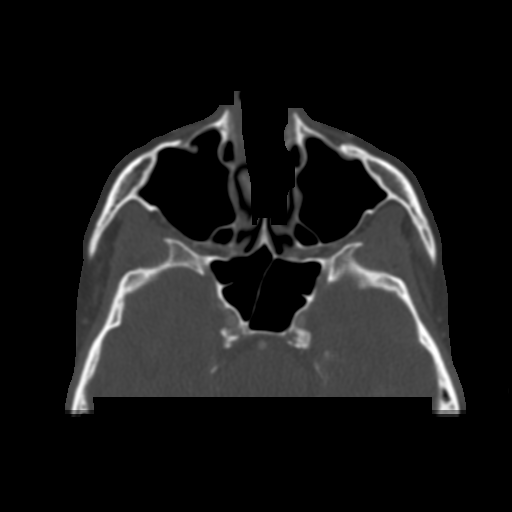

[16 of 33 positions shown; findings below may reference images not displayed]

FINDINGS: Lung apices are clear.  Limited visualization of the
intracranial contents does not show any abnormality.

Parotid glands are normal.  Submandibular glands are normal.  There
has been previous thyroidectomy on the right.  The left lobe of the
thyroid appears unremarkable by CT.  There may be some swelling of
the sternocleidomastoid and strap musculature, but this is not
definite.  There are normal sized level II and level III lymph
nodes bilaterally.  I do not see any enlarged lymph nodes in the
neck.  No mucosal or submucosal lesion is seen.  No superior
mediastinal mass.
IMPRESSION: The patient has had thyroidectomy on the right.  I do not see
evidence of a thyroid mass on the left by CT.  I do not see any
enlarged lymph nodes or masses.  It is possible that there is some
swelling or edema in the strap musculature and sternocleidomastoid
musculature, which would not be unexpected given the history of
previous thyroidectomy 12 days ago.

## 2014-05-08 ENCOUNTER — Encounter (INDEPENDENT_AMBULATORY_CARE_PROVIDER_SITE_OTHER): Payer: Self-pay

## 2014-05-08 ENCOUNTER — Other Ambulatory Visit (INDEPENDENT_AMBULATORY_CARE_PROVIDER_SITE_OTHER): Payer: Self-pay

## 2014-05-08 ENCOUNTER — Encounter (INDEPENDENT_AMBULATORY_CARE_PROVIDER_SITE_OTHER): Payer: Self-pay | Admitting: General Surgery

## 2014-05-08 ENCOUNTER — Ambulatory Visit (INDEPENDENT_AMBULATORY_CARE_PROVIDER_SITE_OTHER): Payer: BC Managed Care – PPO | Admitting: General Surgery

## 2014-05-08 VITALS — BP 126/80 | HR 61 | Temp 97.2°F | Ht 62.0 in | Wt 177.0 lb

## 2014-05-08 DIAGNOSIS — K432 Incisional hernia without obstruction or gangrene: Secondary | ICD-10-CM | POA: Insufficient documentation

## 2014-05-08 DIAGNOSIS — K469 Unspecified abdominal hernia without obstruction or gangrene: Secondary | ICD-10-CM

## 2014-05-08 NOTE — Patient Instructions (Signed)
There is a lot of laxity and elasticity to your abdominal wall.  Also I think I feel a muscle defect, or hernia, just above the umbilicus. This might be related to your appendectomy or it might have developed on its on.  We're going to obtain a CT scan of the abdomen and pelvis to get better definition of the muscle defect to help [place] plan surgical intervention.  Return to see Dr. Dalbert Batman after the CT scan is done.

## 2014-05-08 NOTE — Progress Notes (Signed)
Patient ID: Shari Simmons, female   DOB: Jun 23, 1976, 38 y.o.   MRN: [id number]  Chief Complaint  Patient presents with  . eval hernia    HPI Shari Simmons is a 38 y.o. female.  She is referred by Dr. Vaughan Basta ball bowel and for evaluation of probable hernia of anterior abdominal wall.  The patient has had 3 C-sections through a Pfannenstiel incision. She had a laparoscopic appendectomy by Dr. Johney Maine in 2010. She had to be readmitted for percutaneous drainage of an abscess. She is now recovered. She has a history of total thyroidectomy for papillary thyroid cancer by Dr. Ronnald Collum. All of her  postop management has continued with Dr. Chalmers Cater.    She is taking Synthroid 88 mcg per day.  In terms of the potential hernia, she says it's in the midline just above her umbilicus. It's probably been there since February 2014 after her last C-section. She says it feels funny but is not really painful unless someone presses on it and then is a little bit tender. No GI symptoms.  HPI  Past Medical History  Diagnosis Date  . Cancer     Past Surgical History  Procedure Laterality Date  . Thyroidectomy    . Appendectomy    . Cesarean section      Family History  Problem Relation Age of Onset  . Heart disease Father     Social History History  Substance Use Topics  . Smoking status: Former Smoker    Types: Cigarettes    Quit date: 05/08/2004  . Smokeless tobacco: Not on file  . Alcohol Use: Yes    No Known Allergies  Current Outpatient Prescriptions  Medication Sig Dispense Refill  . Ascorbic Acid (VITAMIN C) 100 MG tablet Take 100 mg by mouth daily.      Marland Kitchen levothyroxine (SYNTHROID, LEVOTHROID) 88 MCG tablet Take 88 mcg by mouth daily before breakfast.       No current facility-administered medications for this visit.    Review of Systems Review of Systems  Constitutional: Negative for fever, chills and unexpected weight change.  HENT: Negative for congestion, hearing loss, sore  throat, trouble swallowing and voice change.   Eyes: Negative for visual disturbance.  Respiratory: Negative for cough and wheezing.   Cardiovascular: Negative for chest pain, palpitations and leg swelling.  Gastrointestinal: Positive for abdominal pain and abdominal distention. Negative for nausea, vomiting, diarrhea, constipation, blood in stool and anal bleeding.  Genitourinary: Negative for hematuria, vaginal bleeding and difficulty urinating.  Musculoskeletal: Negative for arthralgias.  Skin: Negative for rash and wound.  Neurological: Negative for seizures, syncope and headaches.  Hematological: Negative for adenopathy. Does not bruise/bleed easily.  Psychiatric/Behavioral: Negative for confusion.    Blood pressure 126/80, pulse 61, temperature 97.2 F (36.2 C), height 5\' 2"  (1.575 m), weight 177 lb (80.287 kg).  Physical Exam Physical Exam  Constitutional: She is oriented to person, place, and time. She appears well-developed and well-nourished. No distress.  HENT:  Head: Normocephalic and atraumatic.  Nose: Nose normal.  Mouth/Throat: No oropharyngeal exudate.  Eyes: Conjunctivae and EOM are normal. Pupils are equal, round, and reactive to light. Left eye exhibits no discharge. No scleral icterus.  Neck: Neck supple. No JVD present. No tracheal deviation present. No thyromegaly present.  Cardiovascular: Normal rate, regular rhythm, normal heart sounds and intact distal pulses.   No murmur heard. Pulmonary/Chest: Effort normal and breath sounds normal. No respiratory distress. She has no wheezes. She has no rales.  She exhibits no tenderness.  Abdominal: Soft. Bowel sounds are normal. She exhibits no distension and no mass. There is no tenderness. There is no rebound and no guarding.  Pfannenstiel incision well-healed. Transverse scar below umbilicus. Abdomen somewhat protuberant. A little bit of extra subcutaneous fat and skin laxity. Lots of laxity and evantration of her  abdominal wall muscles. I think I feel a defect in the midline above the umbilicus although it is not that large. Not well defined. This does not appear to be a diastases recti.  Musculoskeletal: She exhibits no edema and no tenderness.  Lymphadenopathy:    She has no cervical adenopathy.  Neurological: She is alert and oriented to person, place, and time. She exhibits normal muscle tone. Coordination normal.  Skin: Skin is warm. No rash noted. She is not diaphoretic. No erythema. No pallor.  Psychiatric: She has a normal mood and affect. Her behavior is normal. Judgment and thought content normal.    Data Reviewed Office notes from Dr. York Spaniel  Assessment    Probable incisional hernia, supraumbilical  History papillary thyroid cancer. No known recurrence following total thyroidectomy and radioiodine ablation  History laparoscopic appendectomy for ruptured appendicitis, complicated by postop abscess necessitating percutaneous drainage  History of 3 cesarean sections, most recent Feb.,  2014     Plan    I discussed my impressions and physical findings with the patient and her husband.  Will obtain CT scan of abdomen and pelvis with contrast to better define any defect in the abdominal wall muscle  Return to see me after the CT scan is done.        Adin Hector 05/08/2014, 12:04 PM

## 2014-05-09 ENCOUNTER — Other Ambulatory Visit (INDEPENDENT_AMBULATORY_CARE_PROVIDER_SITE_OTHER): Payer: Self-pay

## 2014-05-09 ENCOUNTER — Telehealth (INDEPENDENT_AMBULATORY_CARE_PROVIDER_SITE_OTHER): Payer: Self-pay | Admitting: General Surgery

## 2014-05-09 ENCOUNTER — Telehealth (INDEPENDENT_AMBULATORY_CARE_PROVIDER_SITE_OTHER): Payer: Self-pay

## 2014-05-09 ENCOUNTER — Ambulatory Visit (HOSPITAL_COMMUNITY)
Admission: RE | Admit: 2014-05-09 | Discharge: 2014-05-09 | Disposition: A | Discharge status: Home / Self Care | Payer: BC Managed Care – PPO | Source: Ambulatory Visit | Attending: General Surgery | Admitting: General Surgery

## 2014-05-09 DIAGNOSIS — K439 Ventral hernia without obstruction or gangrene: Secondary | ICD-10-CM | POA: Insufficient documentation

## 2014-05-09 DIAGNOSIS — R160 Hepatomegaly, not elsewhere classified: Secondary | ICD-10-CM

## 2014-05-09 MED ORDER — IOHEXOL 300 MG/ML  SOLN
80.0000 mL | Freq: Once | INTRAMUSCULAR | Status: AC | PRN
  Administered 2014-05-09: 80 mL via INTRAVENOUS

## 2014-05-09 NOTE — Telephone Encounter (Signed)
Order placed in epic for Mri liver. Order given to referral coord to sched and call pt.

## 2014-05-09 NOTE — Telephone Encounter (Signed)
CT scan of the abdomen shows a 9.8 cm separation of the rectus muscles and eventration of the soft tissue in between. The radiologist suggested that the fascia was intact this may still be a hernia.Also noted was a 4.8 x 3.2 cm solid lesion in the right lobe of the liver, not seen on 09/30/2009. Central irregular hypoattenuating area. Appears to be an adenoma. No adenopathy.  MRI was strongly suggested.  I discussed the findings with the patient. I told her that the priority here was characterization of her liver lesion and decisions regarding that. She has not taken birth control pills in 2 months and I told her not to take them in the future. Our office will schedule MRI of the liver and follow up with me in a timely fashion.  Patient expressed understanding of 2 issues and the priority proposed. She expressed appreciation for the information.   Edsel Petrin. Dalbert Batman, M.D., [facility] General and Minimally invasive Surgery Breast and Colorectal Surgery Office:   [phone] Pager:   [phone]

## 2014-05-09 NOTE — Telephone Encounter (Signed)
Ct result in epic. Sent to Dr Dalbert Batman for review.

## 2014-05-14 ENCOUNTER — Encounter (INDEPENDENT_AMBULATORY_CARE_PROVIDER_SITE_OTHER): Payer: BC Managed Care – PPO | Admitting: General Surgery

## 2014-05-21 ENCOUNTER — Ambulatory Visit (HOSPITAL_COMMUNITY)
Admission: RE | Admit: 2014-05-21 | Discharge: 2014-05-21 | Disposition: A | Discharge status: Home / Self Care | Payer: BC Managed Care – PPO | Source: Ambulatory Visit | Attending: General Surgery | Admitting: General Surgery

## 2014-05-21 DIAGNOSIS — R160 Hepatomegaly, not elsewhere classified: Secondary | ICD-10-CM

## 2014-05-21 DIAGNOSIS — K7689 Other specified diseases of liver: Secondary | ICD-10-CM | POA: Insufficient documentation

## 2014-05-21 MED ORDER — GADOXETATE DISODIUM 0.25 MMOL/ML IV SOLN
6.0000 mL | Freq: Once | INTRAVENOUS | Status: AC | PRN
  Administered 2014-05-21: 6 mL via INTRAVENOUS

## 2014-05-22 ENCOUNTER — Telehealth (INDEPENDENT_AMBULATORY_CARE_PROVIDER_SITE_OTHER): Payer: Self-pay

## 2014-05-22 NOTE — Telephone Encounter (Signed)
Pt called for MR results. Let pt know MRI shows benign FNH. Advised her to follow up with Dr Dalbert Batman on 6/18 and see if area can just be watched or what his recommendations are.

## 2014-05-31 ENCOUNTER — Ambulatory Visit (INDEPENDENT_AMBULATORY_CARE_PROVIDER_SITE_OTHER): Payer: BC Managed Care – PPO | Admitting: General Surgery

## 2014-05-31 ENCOUNTER — Encounter (INDEPENDENT_AMBULATORY_CARE_PROVIDER_SITE_OTHER): Payer: Self-pay | Admitting: General Surgery

## 2014-05-31 VITALS — BP 118/74 | HR 74 | Temp 97.3°F | Ht 62.0 in | Wt 172.0 lb

## 2014-05-31 DIAGNOSIS — K432 Incisional hernia without obstruction or gangrene: Secondary | ICD-10-CM

## 2014-05-31 DIAGNOSIS — K7689 Other specified diseases of liver: Secondary | ICD-10-CM

## 2014-05-31 DIAGNOSIS — M6200 Separation of muscle (nontraumatic), unspecified site: Secondary | ICD-10-CM

## 2014-05-31 DIAGNOSIS — M6208 Separation of muscle (nontraumatic), other site: Secondary | ICD-10-CM

## 2014-05-31 NOTE — Progress Notes (Signed)
Patient ID: Shari Simmons, female   DOB: 1976-10-08, 38 y.o.   MRN: [id number] History: This patient returns to discuss her abdominal bulge and her liver mass. She is concerned about the bulge. CT scan of the abdomen and pelvis shows that the fascia is intact although there is a bulge and evantration, there is no hernia or defect. It also showed a mass in the right lobe of the liver. Subsequent MRI shows a classic MRI image inflow characteristics for benign focal nodular hyperplasia, 4.3 cm. diameter. I gave her a copy of these reports.  I spent a  long time discussing these 2 conditions. She is  accepting of my diagnosis and advice. I offered second opinions and  she seemed interested in that.  Past history, family history, social history, and review of systems are documented on the chart, unchanged, and noncontributory except as described above  Exam: Pleasant young woman. Father and son are with her. No distress Heart regular rate and rhythm, no murmur Lungs clear to auscultation bilaterally Abdomen is soft. Bowel sounds normal. No mass. No tenderness. Pfannenstiel incision well healed. Transverse umbilical scar. . When she does a sit up she does have a bulge in the midline that looks like a diastasis recti. When she stands to the bulge is less but is still present. There is some irregularity and bumpiness around the umbilicus but I do not feel a defect.  Assessment: Diastases recti. Really cannot document a hernia. Patient still concerned about long-term natural history  4.3 cm right liver mass. Classic MRI pattern of FNH. Low risk.  Plan: Regarding the [place], we will plan an MRI of the liver in one year and see me at that time to assure stability.   she was interested in  a second opinion and she will also be referred to a gastroenterologist to get their opinion  Regarding the diastases recti and abdominal bulge, she is also concerned about the natural history of this. She'll be referred to a  plastic surgeon for an opinion about surgical options.    Edsel Petrin. Dalbert Batman, M.D., [facility] General and Minimally invasive Surgery Breast and Colorectal Surgery Office:   [phone] Pager:   [phone]

## 2014-05-31 NOTE — Patient Instructions (Signed)
CT scan of the abdomen does not show a hernia. There is some bulging of the muscles and fascia but the fascia is intact. This looks like a benign condition called a diastases recti.I do not recommend any surgery, since it is not a hernia.  Because of your concern about progressive bulge, And because diastases recti it is sometimes repaired by plastic surgery,you'll be referred for a second opinion to a plastic surgeon at this time  The MRI of your liver shows a fairly classic condition called a benign focal nodular hyperplasia. I do not think that anything further needs to be done about this, other than follow up observation  Because of your concerns, you'll be referred to a gastroenterologist for a second opinion about the liver lesion.  We will schedule MRI of the liver in one year and followup with Shari Simmons at that time.

## 2014-06-01 ENCOUNTER — Telehealth (INDEPENDENT_AMBULATORY_CARE_PROVIDER_SITE_OTHER): Payer: Self-pay

## 2014-06-01 NOTE — Addendum Note (Signed)
Addended by: Dois Davenport on: 06/01/2014 09:20 AM   Modules accepted: Orders

## 2014-06-01 NOTE — Telephone Encounter (Signed)
Orders for Plastic ref, GI ref in epic and to [place] to set up and call pt. Future MRI liver order in epic for June 2016 in epic and to [place] to set up.

## 2014-07-09 ENCOUNTER — Encounter: Payer: Self-pay | Admitting: General Surgery

## 2014-11-02 ENCOUNTER — Other Ambulatory Visit: Payer: Self-pay | Admitting: Dermatology

## 2015-03-28 ENCOUNTER — Other Ambulatory Visit: Payer: Self-pay | Admitting: Dermatology

## 2015-07-12 ENCOUNTER — Ambulatory Visit
Admission: RE | Admit: 2015-07-12 | Discharge: 2015-07-12 | Disposition: A | Discharge status: Home / Self Care | Payer: BLUE CROSS/BLUE SHIELD | Source: Ambulatory Visit | Attending: General Surgery | Admitting: General Surgery

## 2015-07-12 DIAGNOSIS — K7689 Other specified diseases of liver: Secondary | ICD-10-CM

## 2015-07-12 MED ORDER — GADOXETATE DISODIUM 0.25 MMOL/ML IV SOLN
8.0000 mL | Freq: Once | INTRAVENOUS | Status: AC | PRN
  Administered 2015-07-12: 8 mL via INTRAVENOUS

## 2019-10-27 ENCOUNTER — Other Ambulatory Visit: Payer: Self-pay | Admitting: Endocrinology

## 2019-10-27 DIAGNOSIS — N951 Menopausal and female climacteric states: Secondary | ICD-10-CM

## 2020-05-30 ENCOUNTER — Other Ambulatory Visit: Payer: Self-pay | Admitting: Endocrinology

## 2020-05-30 DIAGNOSIS — E890 Postprocedural hypothyroidism: Secondary | ICD-10-CM

## 2020-06-28 ENCOUNTER — Ambulatory Visit
Admission: RE | Admit: 2020-06-28 | Discharge: 2020-06-28 | Disposition: A | Discharge status: Home / Self Care | Payer: BLUE CROSS/BLUE SHIELD | Source: Ambulatory Visit | Attending: Endocrinology | Admitting: Endocrinology

## 2020-06-28 DIAGNOSIS — E890 Postprocedural hypothyroidism: Secondary | ICD-10-CM

## 2023-07-05 ENCOUNTER — Other Ambulatory Visit: Payer: Self-pay | Admitting: Endocrinology

## 2023-07-05 DIAGNOSIS — C73 Malignant neoplasm of thyroid gland: Secondary | ICD-10-CM

## 2023-07-09 ENCOUNTER — Ambulatory Visit
Admission: RE | Admit: 2023-07-09 | Discharge: 2023-07-09 | Disposition: A | Discharge status: Home / Self Care | Payer: Managed Care, Other (non HMO) | Source: Ambulatory Visit | Attending: Endocrinology | Admitting: Endocrinology

## 2023-07-09 DIAGNOSIS — C73 Malignant neoplasm of thyroid gland: Secondary | ICD-10-CM
